# Patient Record
Sex: Male | Born: 1990 | Race: White | Hispanic: No | Marital: Single | Smoking: Current every day smoker
Health system: Southern US, Community
[De-identification: ages and names within clinical notes are randomized; demographics above are authoritative.]

## PROBLEM LIST (undated history)

## (undated) HISTORY — PX: AMPUTATION: SHX166

---
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
## 2009-01-11 ENCOUNTER — Emergency Department (HOSPITAL_COMMUNITY): Admission: EM | Admit: 2009-01-11 | Discharge: 2009-01-11 | Payer: Self-pay | Admitting: Emergency Medicine

## 2009-06-11 ENCOUNTER — Emergency Department (HOSPITAL_COMMUNITY): Admission: EM | Admit: 2009-06-11 | Discharge: 2009-06-12 | Payer: Self-pay | Admitting: Emergency Medicine

## 2010-10-25 ENCOUNTER — Emergency Department (HOSPITAL_COMMUNITY)
Admission: EM | Admit: 2010-10-25 | Discharge: 2010-10-25 | Disposition: A | Discharge status: Home / Self Care | Payer: Self-pay | Attending: Emergency Medicine | Admitting: Emergency Medicine

## 2010-10-25 DIAGNOSIS — M545 Low back pain, unspecified: Secondary | ICD-10-CM | POA: Insufficient documentation

## 2010-10-25 DIAGNOSIS — X500XXA Overexertion from strenuous movement or load, initial encounter: Secondary | ICD-10-CM | POA: Insufficient documentation

## 2010-10-25 DIAGNOSIS — IMO0002 Reserved for concepts with insufficient information to code with codable children: Secondary | ICD-10-CM | POA: Insufficient documentation

## 2010-10-25 DIAGNOSIS — Y990 Civilian activity done for income or pay: Secondary | ICD-10-CM | POA: Insufficient documentation

## 2010-10-25 DIAGNOSIS — R110 Nausea: Secondary | ICD-10-CM | POA: Insufficient documentation

## 2010-10-25 DIAGNOSIS — S335XXA Sprain of ligaments of lumbar spine, initial encounter: Secondary | ICD-10-CM | POA: Insufficient documentation

## 2010-10-25 DIAGNOSIS — M4120 Other idiopathic scoliosis, site unspecified: Secondary | ICD-10-CM | POA: Insufficient documentation

## 2011-05-04 ENCOUNTER — Emergency Department (HOSPITAL_COMMUNITY)
Admission: EM | Admit: 2011-05-04 | Discharge: 2011-05-04 | Disposition: A | Discharge status: Home / Self Care | Payer: Self-pay | Attending: Emergency Medicine | Admitting: Emergency Medicine

## 2011-05-04 ENCOUNTER — Emergency Department (HOSPITAL_COMMUNITY): Payer: Self-pay

## 2011-05-04 ENCOUNTER — Encounter (HOSPITAL_COMMUNITY): Payer: Self-pay | Admitting: Emergency Medicine

## 2011-05-04 DIAGNOSIS — Z79899 Other long term (current) drug therapy: Secondary | ICD-10-CM | POA: Insufficient documentation

## 2011-05-04 DIAGNOSIS — M545 Low back pain, unspecified: Secondary | ICD-10-CM | POA: Insufficient documentation

## 2011-05-04 DIAGNOSIS — X500XXA Overexertion from strenuous movement or load, initial encounter: Secondary | ICD-10-CM | POA: Insufficient documentation

## 2011-05-04 DIAGNOSIS — Y990 Civilian activity done for income or pay: Secondary | ICD-10-CM | POA: Insufficient documentation

## 2011-05-04 DIAGNOSIS — S39012A Strain of muscle, fascia and tendon of lower back, initial encounter: Secondary | ICD-10-CM

## 2011-05-04 DIAGNOSIS — S339XXA Sprain of unspecified parts of lumbar spine and pelvis, initial encounter: Secondary | ICD-10-CM | POA: Insufficient documentation

## 2011-05-04 MED ORDER — IBUPROFEN 800 MG PO TABS
800.0000 mg | ORAL_TABLET | Freq: Once | ORAL | Status: AC
  Administered 2011-05-04: 800 mg via ORAL
  Filled 2011-05-04: qty 1

## 2011-05-04 MED ORDER — CYCLOBENZAPRINE HCL 10 MG PO TABS: 10.0000 mg | ORAL_TABLET | Freq: Three times a day (TID) | ORAL | Status: AC | PRN

## 2011-05-04 MED ORDER — OXYCODONE-ACETAMINOPHEN 5-325 MG PO TABS
1.0000 | ORAL_TABLET | Freq: Once | ORAL | Status: AC
  Administered 2011-05-04: 1 via ORAL
  Filled 2011-05-04: qty 1

## 2011-05-04 MED ORDER — CYCLOBENZAPRINE HCL 10 MG PO TABS
10.0000 mg | ORAL_TABLET | Freq: Once | ORAL | Status: AC
  Administered 2011-05-04: 10 mg via ORAL
  Filled 2011-05-04: qty 1

## 2011-05-04 MED ORDER — OXYCODONE-ACETAMINOPHEN 5-325 MG PO TABS: 1.0000 | ORAL_TABLET | ORAL | Status: AC | PRN

## 2011-05-04 NOTE — ED Provider Notes (Signed)
History     CSN: [id number]  Arrival date & time 05/04/11  2000   First MD Initiated Contact with Patient 05/04/11 2128      Chief Complaint  Patient presents with  . Back Pain    (Consider location/radiation/quality/duration/timing/severity/associated sxs/prior treatment) Patient is a 21 y.o. male presenting with back pain. The history is provided by the patient.  Back Pain   He was doing some lifting at work, and thinks that he got into an altered position. He felt something pull in his lower back. He has a history of prior low back injury. Since then, he has had pain in the midline of his lower back without radiation. Pain is moderately severe at 8/10. Is worse with certain positions and moving in certain ways. He tried taking ibuprofen 400 mg with no relief. He denies radiation of pain denies weakness, numbness, tingling, and denies bowel or bladder dysfunction.  History reviewed. No pertinent past medical history.  History reviewed. No pertinent past surgical history.  No family history on file.  History  Substance Use Topics  . Smoking status: Never Smoker   . Smokeless tobacco: Not on file  . Alcohol Use: No      Review of Systems  Musculoskeletal: Positive for back pain.  All other systems reviewed and are negative.    Allergies  Ceclor  Home Medications   Current Outpatient Rx  Name Route Sig Dispense Refill  . CYCLOBENZAPRINE HCL 10 MG PO TABS Oral Take 1 tablet (10 mg total) by mouth 3 (three) times daily as needed for muscle spasms. 30 tablet 0  . OXYCODONE-ACETAMINOPHEN 5-325 MG PO TABS Oral Take 1 tablet by mouth every 4 (four) hours as needed for pain. 20 tablet 0    BP 98/45  Pulse 87  Temp(Src) 98.5 F (36.9 C) (Oral)  Resp 20  SpO2 97%  Physical Exam  Nursing note and vitals reviewed.  21 year old male is resting comfortably in no acute distress. Vital signs are normal. Oxygen saturation is 97% which is normal. Head is normocephalic and  atraumatic. PERRLA, EOMI. Her Chalmers Guest is clear. Neck is nontender and supple. Back has moderate tenderness in the lumbar spine with moderate bilateral paralumbar spasm. Straight leg raise is negative. Lungs are clear without rales, wheezes, rhonchi. Heart has regular rate and rhythm without murmur. Abdomen is soft, flat, nontender without masses or hepatosplenomegaly. Extremities have full range of motion, no cyanosis or edema. Skin is warm and dry without rash. Neurologic: Mental status is normal, cranial nerves are intact, there no focal motor or sensory deficits.  ED Course  Procedures (including critical care time)  Labs Reviewed - No data to display Dg Lumbar Spine Complete  05/04/2011  *RADIOLOGY REPORT*  Clinical Data: Low back pain after lifting  LUMBAR SPINE - COMPLETE 4+ VIEW  Comparison: None.  Findings: There is transitional anatomy.  L5 is transitional.  Disc heights are within normal limits.  No pars defects.  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 These can be painful.  IMPRESSION: Transitional L5.  No acute or traumatic finding.  Transitional anatomy can predispose to pain.  Original Report Authenticated By: Thomasenia Sales, M.D.     1. Lumbosacral strain       MDM  Musculoskeletal back pain related to poor lifting technique. He is sent home with a prescription for Percocet and Flexeril. He is to take over-the-counter ibuprofen or naproxen for anti-inflammatory effect.        Onalee Hua  Preston Fleeting, MD 05/05/11 1500

## 2011-05-04 NOTE — ED Notes (Signed)
Pt to ED c/o lower back pain x 2 days after lifting metal.  Denies numbness, tingling or change in bowel or bladder habits.  States no relief with ibuprofen and pain increases on movement and bending.

## 2011-05-04 NOTE — Discharge Instructions (Signed)
In addition to the prescription medicines, and he should take an anti-inflammatory medication. Take 2 Aleve tablets at a time, twice a day. Alternatively, you can take 12 ibuprofen tablets a day-either 3 tablets at a time 4 times a day, or 4 tablets at a time 3 times a day. Pay attention to proper posture, bending, and lifting techniques.  Lumbosacral Strain Lumbosacral strain is one of the most common causes of back pain. There are many causes of back pain. Most are not serious conditions. CAUSES  Your backbone (spinal column) is made up of 24 main vertebral bodies, the sacrum, and the coccyx. These are held together by muscles and tough, fibrous tissue (ligaments). Nerve roots pass through the openings between the vertebrae. A sudden move or injury to the back may cause injury to, or pressure on, these nerves. This may result in localized back pain or pain movement (radiation) into the buttocks, down the leg, and into the foot. Sharp, shooting pain from the buttock down the back of the leg (sciatica) is frequently associated with a ruptured (herniated) disk. Pain may be caused by muscle spasm alone. Your caregiver can often find the cause of your pain by the details of your symptoms and an exam. In some cases, you may need tests (such as X-rays). Your caregiver will work with you to decide if any tests are needed based on your specific exam. HOME CARE INSTRUCTIONS   Avoid an underactive lifestyle. Active exercise, as directed by your caregiver, is your greatest weapon against back pain.   Avoid hard physical activities (tennis, racquetball, waterskiing) if you are not in proper physical condition for it. This may aggravate or create problems.   If you have a back problem, avoid sports requiring sudden body movements. Swimming and walking are generally safer activities.   Maintain good posture.   Avoid becoming overweight (obese).   Use bed rest for only the most extreme, sudden (acute) episode.  Your caregiver will help you determine how much bed rest is necessary.   For acute conditions, you may put ice on the injured area.   Put ice in a plastic bag.   Place a towel between your skin and the bag.   Leave the ice on for 15 to 20 minutes at a time, every 2 hours, or as needed.   After you are improved and more active, it may help to apply heat for 30 minutes before activities.  See your caregiver if you are having pain that lasts longer than expected. Your caregiver can advise appropriate exercises or therapy if needed. With conditioning, most back problems can be avoided. SEEK IMMEDIATE MEDICAL CARE IF:   You have numbness, tingling, weakness, or problems with the use of your arms or legs.   You experience severe back pain not relieved with medicines.   There is a change in bowel or bladder control.   You have increasing pain in any area of the body, including your belly (abdomen).   You notice shortness of breath, dizziness, or feel faint.   You feel sick to your stomach (nauseous), are throwing up (vomiting), or become sweaty.   You notice discoloration of your toes or legs, or your feet get very cold.   Your back pain is getting worse.   You have a fever.  MAKE SURE YOU:   Understand these instructions.  Will watch your condition. Cyclobenzaprine tablets What is this medicine? CYCLOBENZAPRINE (sye kloe BEN za preen) is a muscle relaxer. It is used  to treat muscle pain, spasms, and stiffness. This medicine may be used for other purposes; ask your health care provider or pharmacist if you have questions. What should I tell my health care provider before I take this medicine? They need to know if you have any of these conditions: -heart disease, irregular heartbeat, or previous heart attack -liver disease -thyroid problem -an unusual or allergic reaction to cyclobenzaprine, tricyclic antidepressants, lactose, other medicines, foods, dyes, or  preservatives -pregnant or trying to get pregnant -breast-feeding How should I use this medicine? Take this medicine by mouth with a glass of water. Follow the directions on the prescription label. If this medicine upsets your stomach, take it with food or milk. Take your medicine at regular intervals. Do not take it more often than directed. Talk to your pediatrician regarding the use of this medicine in children. Special care may be needed. Overdosage: If you think you have taken too much of this medicine contact a poison control center or emergency room at once. NOTE: This medicine is only for you. Do not share this medicine with others. What if I miss a dose? If you miss a dose, take it as soon as you can. If it is almost time for your next dose, take only that dose. Do not take double or extra doses. What may interact with this medicine? Do not take this medicine with any of the following medications: -cisapride -droperidol -flecainide -grepafloxacin -halofantrine -levomethadyl -MAOIs like Carbex, Eldepryl, Marplan, Nardil, and Parnate -nilotinib -pimozide -probucol -sertindole This medicine may also interact with the following medications: -abarelix -alcohol -contrast dyes -dolasetron -guanethidine -medicines for cancer -medicines for depression, anxiety, or psychotic disturbances -medicines to treat an irregular heartbeat -medicines used for sleep or numbness during surgery or procedure -methadone -octreotide -ondansetron -palonosetron -phenothiazines like chlorpromazine, mesoridazine, prochlorperazine, thioridazine -some medicines for infection like alfuzosin, chloroquine, clarithromycin, levofloxacin, mefloquine, pentamidine, troleandomycin -tramadol -vardenafil This list may not describe all possible interactions. Give your health care provider a list of all the medicines, herbs, non-prescription drugs, or dietary supplements you use. Also tell them if you smoke,  drink alcohol, or use illegal drugs. Some items may interact with your medicine. What should I watch for while using this medicine? Check with your doctor or health care professional if your condition does not improve within 1 to 3 weeks. You may get drowsy or dizzy when you first start taking the medicine or change doses. Do not drive, use machinery, or do anything that may be dangerous until you know how the medicine affects you. Stand or sit up slowly. Your mouth may get dry. Drinking water, chewing sugarless gum, or sucking on hard candy may help. What side effects may I notice from receiving this medicine? Side effects that you should report to your doctor or health care professional as soon as possible: -allergic reactions like skin rash, itching or hives, swelling of the face, lips, or tongue -chest pain -fast heartbeat -hallucinations -seizures -vomiting Side effects that usually do not require medical attention (report to your doctor or health care professional if they continue or are bothersome): -headache This list may not describe all possible side effects. Call your doctor for medical advice about side effects. You may report side effects to FDA at 1-800-FDA-1088. Where should I keep my medicine? Keep out of the reach of children. Store at room temperature between 15 and 30 degrees C (59 and 86 degrees F). Keep container tightly closed. Throw away any unused medicine after the expiration date.  NOTE: This sheet is a summary. It may not cover all possible information. If you have questions about this medicine, talk to your doctor, pharmacist, or health care provider.   2012, Elsevier/Gold Standard. (05/25/2007 10:26:21 PM)  Will get help right away if you are not doing well or get worse.  Document Released: 11/21/2004 Document Revised: 01/31/2011 Document Reviewed: 05/13/2008 [facility] Patient Information 2012 [place], [place].  Acetaminophen; Oxycodone tablets What is this  medicine? ACETAMINOPHEN; OXYCODONE (a set a MEE noe fen; ox i KOE done) is a pain reliever. It is used to treat mild to moderate pain. This medicine may be used for other purposes; ask your health care provider or pharmacist if you have questions. What should I tell my health care provider before I take this medicine? They need to know if you have any of these conditions: -brain tumor -Crohn's disease, inflammatory bowel disease, or ulcerative colitis -drink more than 3 alcohol containing drinks per day -drug abuse or addiction -head injury -heart or circulation problems -kidney disease or problems going to the bathroom -liver disease -lung disease, asthma, or breathing problems -an unusual or allergic reaction to acetaminophen, oxycodone, other opioid analgesics, other medicines, foods, dyes, or preservatives -pregnant or trying to get pregnant -breast-feeding How should I use this medicine? Take this medicine by mouth with a full glass of water. Follow the directions on the prescription label. Take your medicine at regular intervals. Do not take your medicine more often than directed. Talk to your pediatrician regarding the use of this medicine in children. Special care may be needed. Patients over 23 years old may have a stronger reaction and need a smaller dose. Overdosage: If you think you have taken too much of this medicine contact a poison control center or emergency room at once. NOTE: This medicine is only for you. Do not share this medicine with others. What if I miss a dose? If you miss a dose, take it as soon as you can. If it is almost time for your next dose, take only that dose. Do not take double or extra doses. What may interact with this medicine? -alcohol or medicines that contain alcohol -antihistamines -barbiturates like amobarbital, butalbital, butabarbital, methohexital, pentobarbital, phenobarbital, thiopental, and secobarbital -benztropine -drugs for bladder  problems like solifenacin, trospium, oxybutynin, tolterodine, hyoscyamine, and methscopolamine -drugs for breathing problems like ipratropium and tiotropium -drugs for certain stomach or intestine problems like propantheline, homatropine methylbromide, glycopyrrolate, atropine, belladonna, and dicyclomine -general anesthetics like etomidate, ketamine, nitrous oxide, propofol, desflurane, enflurane, halothane, isoflurane, and sevoflurane -medicines for depression, anxiety, or psychotic disturbances -medicines for pain like codeine, morphine, pentazocine, buprenorphine, butorphanol, nalbuphine, tramadol, and propoxyphene -medicines for sleep -muscle relaxants -naltrexone -phenothiazines like perphenazine, thioridazine, chlorpromazine, mesoridazine, fluphenazine, prochlorperazine, promazine, and trifluoperazine -scopolamine -trihexyphenidyl This list may not describe all possible interactions. Give your health care provider a list of all the medicines, herbs, non-prescription drugs, or dietary supplements you use. Also tell them if you smoke, drink alcohol, or use illegal drugs. Some items may interact with your medicine. What should I watch for while using this medicine? Tell your doctor or health care professional if your pain does not go away, if it gets worse, or if you have new or a different type of pain. You may develop tolerance to the medicine. Tolerance means that you will need a higher dose of the medication for pain relief. Tolerance is normal and is expected if you take this medicine for a long time. Do not suddenly stop taking  your medicine because you may develop a severe reaction. Your body becomes used to the medicine. This does NOT mean you are addicted. Addiction is a behavior related to getting and using a drug for a nonmedical reason. If you have pain, you have a medical reason to take pain medicine. Your doctor will tell you how much medicine to take. If your doctor wants you to  stop the medicine, the dose will be slowly lowered over time to avoid any side effects. You may get drowsy or dizzy. Do not drive, use machinery, or do anything that needs mental alertness until you know how this medicine affects you. Do not stand or sit up quickly, especially if you are an older patient. This reduces the risk of dizzy or fainting spells. Alcohol may interfere with the effect of this medicine. Avoid alcoholic drinks. The medicine will cause constipation. Try to have a bowel movement at least every 2 to 3 days. If you do not have a bowel movement for 3 days, call your doctor or health care professional. Do not take Tylenol (acetaminophen) or medicines that have acetaminophen with this medicine. Too much acetaminophen can be very dangerous. Many nonprescription medicines contain acetaminophen. Always read the labels carefully to avoid taking more acetaminophen. What side effects may I notice from receiving this medicine? Side effects that you should report to your doctor or health care professional as soon as possible: -allergic reactions like skin rash, itching or hives, swelling of the face, lips, or tongue -breathing difficulties, wheezing -confusion -light headedness or fainting spells -severe stomach pain -yellowing of the skin or the whites of the eyes Side effects that usually do not require medical attention (report to your doctor or health care professional if they continue or are bothersome): -dizziness -drowsiness -nausea -vomiting This list may not describe all possible side effects. Call your doctor for medical advice about side effects. You may report side effects to FDA at 1-800-FDA-1088. Where should I keep my medicine? Keep out of the reach of children. This medicine can be abused. Keep your medicine in a safe place to protect it from theft. Do not share this medicine with anyone. Selling or giving away this medicine is dangerous and against the law. Store at room  temperature between 20 and 25 degrees C (68 and 77 degrees F). Keep container tightly closed. Protect from light. Flush any unused medicines down the toilet. Do not use the medicine after the expiration date. NOTE: This sheet is a summary. It may not cover all possible information. If you have questions about this medicine, talk to your doctor, pharmacist, or health care provider.  2012, Elsevier/Gold Standard. (01/11/2008 10:01:21 AM)  RESOURCE GUIDE  Dental Problems  Patients with Medicaid: [facility] [phone] [address]                                           [phone] [address] Phone:  [phone]                                                  Phone:  [phone]  If  unable to pay or uninsured, contact:  [facility] or [facility]. to become qualified for the adult dental clinic.  Chronic Pain Problems Contact [facility]  [phone] Patients need to be referred by their primary care doctor.  Insufficient Money for Medicine Contact [employer]:  call "211" or [facility] ([phone].  No Primary Care Doctor Call [facility]  727-005-1059 Other agencies that provide inexpensive medical care    [facility]  413-327-2680    [facility]  8603030454    [facility]  2516169854    [facility]  [phone]    [facility]anned Parenthood  9297294268    [facility]  [phone]  Psychological Services [facility]  [phone] [facility]  [phone] [facility]   [phone] (emergency services [phone])  Substance Abuse Resources [facility]  930-586-6560 [facility] 7256293462 [facility] [place] [phone] Floydene Flock [phone] Residential & Outpatient Substance Abuse Program  [phone]  Abuse/Neglect [facility] Child Abuse Hotline 4138050458 [facility] Child Abuse  Hotline 705-519-3996 (After Hours)  Emergency Shelter [facility] 6705368325  M[facility] Homes [facility] [place] [facility] [place] [phone] [facility] 6800651825  MRSA Hotline #:   309-075-7285    [facility] Resources  [facility]     [employer]                          [facility] Dept. [address] [place]                       [address]      [address]  Blondell Reveal Phone:  [phone]                                   Phone:  [phone]                 Phone:  [phone]  [facility] Phone:  [phone]  [facility] Child Abuse Hotline [phone] (After Hours)

## 2011-05-04 NOTE — ED Notes (Signed)
Pt ambulatory with 8/10 pain to low back.  Denies n/v NAD.

## 2011-05-04 NOTE — ED Notes (Signed)
PT. 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  AMBULATORY.

## 2011-07-11 ENCOUNTER — Emergency Department (HOSPITAL_COMMUNITY)
Admission: EM | Admit: 2011-07-11 | Discharge: 2011-07-11 | Disposition: A | Discharge status: Home / Self Care | Payer: Self-pay | Attending: Emergency Medicine | Admitting: Emergency Medicine

## 2011-07-11 ENCOUNTER — Encounter (HOSPITAL_COMMUNITY): Payer: Self-pay | Admitting: *Deleted

## 2011-07-11 DIAGNOSIS — F17200 Nicotine dependence, unspecified, uncomplicated: Secondary | ICD-10-CM | POA: Insufficient documentation

## 2011-07-11 DIAGNOSIS — K047 Periapical abscess without sinus: Secondary | ICD-10-CM | POA: Insufficient documentation

## 2011-07-11 MED ORDER — HYDROCODONE-ACETAMINOPHEN 5-325 MG PO TABS: 1.0000 | ORAL_TABLET | ORAL | Status: AC | PRN

## 2011-07-11 MED ORDER — AMOXICILLIN 500 MG PO CAPS: 500.0000 mg | ORAL_CAPSULE | Freq: Three times a day (TID) | ORAL | Status: AC

## 2011-07-11 NOTE — ED Provider Notes (Signed)
Medical screening examination/treatment/procedure(s) were performed by non-physician practitioner and as supervising physician I was immediately available for consultation/collaboration.   Caleesi Kohl Y. Rose Hippler, MD 07/11/11 2315 

## 2011-07-11 NOTE — Discharge Instructions (Signed)
Abscessed Tooth A tooth abscess is a collection of infected fluid (pus) from a bacterial infection in the inner part of the tooth (pulp). It usually occurs at the end of the tooth's root.  CAUSES   A very bad cavity (extensive tooth decay).   Trauma to the tooth, such as a broken or chipped tooth, that allows bacteria to enter into the pulp.  SYMPTOMS  Severe pain in and around the infected tooth.   Swelling and redness around the abscessed tooth or in the mouth or face.   Tenderness.   Pus drainage.   Bad breath.   Bitter taste in the mouth.   Difficulty swallowing.   Difficulty opening the mouth.   Feeling sick to your stomach (nauseous).   Vomiting.   Chills.   Swollen neck glands.  DIAGNOSIS  A medical and dental history will be taken.   An examination will be performed by tapping on the abscessed tooth.   X-rays may be taken of the tooth to identify the abscess.  TREATMENT The goal of treatment is to eliminate the infection.   You may be prescribed antibiotic medicine to stop the infection from spreading.   A root canal may be performed to save the tooth. If the tooth cannot be saved, it may be pulled (extracted) and the abscess may be drained.  HOME CARE INSTRUCTIONS  Only take over-the-counter or prescription medicines for pain, fever, or discomfort as directed by your caregiver.   Do not drive after taking pain medicine (narcotics).   Rinse your mouth (gargle) often with salt water ( tsp salt in 8 oz of warm water) to relieve pain or swelling.   Do not apply heat to the outside of your face.   Return to your dentist for further treatment as directed.  SEEK IMMEDIATE DENTAL CARE IF:  You have a temperature by mouth above 102 F (38.9 C), not controlled by medicine.   You have chills or a very bad headache.   You have problems breathing or swallowing.   Your have trouble opening your mouth.   You develop swelling in the neck or around the eye.    Your pain is not helped by medicine.   Your pain is getting worse instead of better.  Document Released: 02/11/2005 Document Revised: 01/31/2011 Document Reviewed: 05/22/2010 [facility] Patient Information 2012 [place], [place].Dental Abscess A dental abscess usually starts from an infected tooth. Antibiotic medicine and pain pills can be helpful, but dental infections require the attention of a dentist. Rinse around the infected area often with salt water (a pinch of salt in 8 oz of warm water). Do not apply heat to the outside of your face. See your dentist or oral surgeon as soon as possible.  SEEK IMMEDIATE MEDICAL CARE IF:  You have increasing, severe pain that is not relieved by medicine.   You or your child has an oral temperature above 102 F (38.9 C), not controlled by medicine.   Your baby is older than 3 months with a rectal temperature of 102 F (38.9 C) or higher.   Your baby is 9 months old or younger with a rectal temperature of 100.4 F (38 C) or higher.   You develop chills, severe headache, difficulty breathing, or trouble swallowing.   You have swelling in the neck or around the eye.  Document Released: 02/11/2005 Document Revised: 01/31/2011 Document Reviewed: 07/23/2006 [facility] Patient Information 2012 [employer] [employer], [place].Dental Care and Dentist Visits Dental care supports good overall health. Regular dental  visits can also help you avoid dental pain, bleeding, infection, and other more serious health problems in the future. It is important to keep the mouth healthy because diseases in the teeth, gums, and other oral tissues can spread to other areas of the body. Some problems, such as diabetes, heart disease, and pre-term labor have been associated with poor oral health.  See your dentist every 6 months. If you experience emergency problems such as a toothache or broken tooth, go to the dentist right away. If you see your dentist regularly, you may catch problems early.  It is easier to be treated for problems in the early stages.  WHAT TO EXPECT AT A DENTIST VISIT  Your dentist will look for many common oral health problems and recommend proper treatment. At your regular dental visit, you can expect:  Gentle cleaning of the teeth and gums. This includes scraping and polishing. This helps to remove the sticky substance around the teeth and gums (plaque). Plaque forms in the mouth shortly after eating. Over time, plaque hardens on the teeth as tartar. If tartar is not removed regularly, it can cause problems. Cleaning also helps remove stains.   Periodic X-rays. These pictures of the teeth and supporting bone will help your dentist assess the health of your teeth.   Periodic fluoride treatments. Fluoride is a natural mineral shown to help strengthen teeth. Fluoride treatmentinvolves applying a fluoride gel or varnish to the teeth. It is most commonly done in children.   Examination of the mouth, tongue, jaws, teeth, and gums to look for any oral health problems, such as:   Cavities (dental caries). This is decay on the tooth caused by plaque, sugar, and acid in the mouth. It is best to catch a cavity when it is small.   Inflammation of the gums caused by plaque buildup (gingivitis).   Problems with the mouth or malformed or misaligned teeth.   Oral cancer or other diseases of the soft tissues or jaws.  KEEP YOUR TEETH AND GUMS HEALTHY For healthy teeth and gums, follow these general guidelines as well as your dentist's specific advice:  Have your teeth professionally cleaned at the dentist every 6 months.   Brush twice daily with a fluoride toothpaste.   Floss your teeth daily.   Ask your dentist if you need fluoride supplements, treatments, or fluoride toothpaste.   Eat a healthy diet. Reduce foods and drinks with added sugar.   Avoid smoking.  TREATMENT FOR ORAL HEALTH PROBLEMS If you have oral health problems, treatment varies depending on the  conditions present in your teeth and gums.  Your caregiver will most likely recommend good oral hygiene at each visit.   For cavities, gingivitis, or other oral health disease, your caregiver will perform a procedure to treat the problem. This is typically done at a separate appointment. Sometimes your caregiver will refer you to another dental specialist for specific tooth problems or for surgery.  SEEK IMMEDIATE DENTAL CARE IF:  You have pain, bleeding, or soreness in the gum, tooth, jaw, or mouth area.   A permanent tooth becomes loose or separated from the gum socket.   You experience a blow or injury to the mouth or jaw area.  Document Released: 10/24/2010 Document Revised: 01/31/2011 Document Reviewed: 10/24/2010 [facility] Patient Information 2012 [place], [place].

## 2011-07-11 NOTE — ED Provider Notes (Signed)
History     CSN: [id number]  Arrival date & time 07/11/11  Barry Brunner   First MD Initiated Contact with Patient 07/11/11 2000      Chief Complaint  Patient presents with  . Dental Pain    (Consider location/radiation/quality/duration/timing/severity/associated sxs/prior treatment) HPI Comments: Patient here with left lower 3rd molar pain - reports started yesterday - no known injury, no trismus, no prior history of dental issues - no fever or chills.  Patient is a 21 y.o. male presenting with tooth pain. The history is provided by the patient. No language interpreter was used.  Dental PainThe primary symptoms include mouth pain. Primary symptoms do not include dental injury, oral bleeding, oral lesions, headaches, fever, shortness of breath, sore throat, angioedema or cough. The symptoms began yesterday. The symptoms are unchanged. The symptoms are new. The symptoms occur constantly.  Additional symptoms include: dental sensitivity to temperature, gum swelling, gum tenderness and jaw pain. Additional symptoms do not include: purulent gums, trismus, facial swelling, trouble swallowing, pain with swallowing, excessive salivation, dry mouth, taste disturbance, smell disturbance, drooling, ear pain, hearing loss, nosebleeds, swollen glands, goiter and fatigue. Medical issues include: smoking.    History reviewed. No pertinent past medical history.  History reviewed. No pertinent past surgical history.  History reviewed. No pertinent family history.  History  Substance Use Topics  . Smoking status: Current Everyday Smoker -- 0.5 packs/day  . Smokeless tobacco: Not on file  . Alcohol Use: No      Review of Systems  Constitutional: Negative for fever and fatigue.  HENT: Negative for hearing loss, ear pain, nosebleeds, sore throat, facial swelling, drooling and trouble swallowing.   Respiratory: Negative for cough and shortness of breath.   Neurological: Negative for headaches.  All other  systems reviewed and are negative.    Allergies  Ceclor  Home Medications  No current outpatient prescriptions on file.  BP 132/64  Pulse 96  Temp(Src) 97.9 F (36.6 C) (Oral)  Resp 16  SpO2 100%  Physical Exam  Nursing note and vitals reviewed. Constitutional: He is oriented to person, place, and time. He appears well-developed and well-nourished. No distress.  HENT:  Head: Normocephalic and atraumatic.  Right Ear: External ear normal.  Left Ear: External ear normal.  Nose: Nose normal.  Mouth/Throat: Oropharynx is clear and moist. Dental abscesses present. No oropharyngeal exudate.    Eyes: Conjunctivae are normal. Pupils are equal, round, and reactive to light. No scleral icterus.  Neck: Normal range of motion. Neck supple.  Cardiovascular: Normal rate and regular rhythm.  Exam reveals no gallop and no friction rub.   Murmur heard.      3/5 systolic murmur  Pulmonary/Chest: Effort normal and breath sounds normal. No respiratory distress. He has no wheezes. He has no rales. He exhibits no tenderness.  Abdominal: Soft. Bowel sounds are normal. He exhibits no distension and no mass. There is no tenderness. There is no rebound and no guarding.  Musculoskeletal: Normal range of motion. He exhibits no edema and no tenderness.  Lymphadenopathy:    He has no cervical adenopathy.  Neurological: He is alert and oriented to person, place, and time. No cranial nerve deficit.  Skin: Skin is warm and dry. No rash noted. No erythema. No pallor.  Psychiatric: He has a normal mood and affect. His behavior is normal. Judgment and thought content normal.    ED Course  Procedures (including critical care time)  Labs Reviewed - No data to display No results found.  Dental Abscess    MDM  Patient with cavity at left 3rd molar with pain and early abscess formation, will treat for same, no evidence of facial swelling or trismus.        Izola Price [place], [place] 07/11/11  2008

## 2011-07-11 NOTE — ED Notes (Signed)
Pt c/o dental pain on left lower back tooth since last night, no known dental problems, tylenol at home

## 2011-09-21 ENCOUNTER — Emergency Department (HOSPITAL_COMMUNITY)
Admission: EM | Admit: 2011-09-21 | Discharge: 2011-09-21 | Disposition: A | Discharge status: Home / Self Care | Payer: Self-pay | Attending: Emergency Medicine | Admitting: Emergency Medicine

## 2011-09-21 ENCOUNTER — Encounter (HOSPITAL_COMMUNITY): Payer: Self-pay

## 2011-09-21 DIAGNOSIS — K0889 Other specified disorders of teeth and supporting structures: Secondary | ICD-10-CM

## 2011-09-21 DIAGNOSIS — F17200 Nicotine dependence, unspecified, uncomplicated: Secondary | ICD-10-CM | POA: Insufficient documentation

## 2011-09-21 DIAGNOSIS — K089 Disorder of teeth and supporting structures, unspecified: Secondary | ICD-10-CM | POA: Insufficient documentation

## 2011-09-21 MED ORDER — HYDROCODONE-ACETAMINOPHEN 5-325 MG PO TABS: 1.0000 | ORAL_TABLET | ORAL | Status: AC | PRN

## 2011-09-21 MED ORDER — LIDOCAINE VISCOUS 2 % MT SOLN
20.0000 mL | Freq: Once | OROMUCOSAL | Status: AC
  Administered 2011-09-21: 20 mL via OROMUCOSAL
  Filled 2011-09-21: qty 15

## 2011-09-21 MED ORDER — HYDROCODONE-ACETAMINOPHEN 5-325 MG PO TABS
1.0000 | ORAL_TABLET | Freq: Once | ORAL | Status: AC
  Administered 2011-09-21: 1 via ORAL
  Filled 2011-09-21: qty 1

## 2011-09-21 NOTE — ED Provider Notes (Signed)
History     CSN: [id number]  Arrival date & time 09/21/11  1308   First MD Initiated Contact with Patient 09/21/11 1832      Chief Complaint  Patient presents with  . Dental Pain    (Consider location/radiation/quality/duration/timing/severity/associated sxs/prior treatment) Patient is a 21 y.o. male presenting with tooth pain.  Dental Pain  This generally well male presents with 3 days of pain on his left lower jaw.  Pain began insidiously, since onset has been dull, sore, worsening, severe, not improved with ibuprofen.  He denies concurrent nausea, vomiting, headache, fevers, chills, chest pain, dyspnea. No past medical history on file.  No past surgical history on file.  No family history on file.  History  Substance Use Topics  . Smoking status: Current Everyday Smoker -- 0.5 packs/day  . Smokeless tobacco: Not on file  . Alcohol Use: No      Review of Systems  All other systems reviewed and are negative.    Allergies  Ceclor  Home Medications   Current Outpatient Rx  Name Route Sig Dispense Refill  . HYDROCODONE-ACETAMINOPHEN 5-325 MG PO TABS Oral Take 1 tablet by mouth every 4 (four) hours as needed for pain. 15 tablet 0    BP 126/59  Pulse 86  Temp 98.4 F (36.9 C) (Oral)  Resp 18  SpO2 99%  Physical Exam  Nursing note and vitals reviewed. Constitutional: He is oriented to person, place, and time. Vital signs are normal. He appears well-developed and well-nourished.  HENT:  Head: Normocephalic and atraumatic.  Mouth/Throat: Uvula is midline, oropharynx is clear and moist and mucous membranes are normal. No oropharyngeal exudate, posterior oropharyngeal edema, posterior oropharyngeal erythema or tonsillar abscesses.    Eyes: Conjunctivae and EOM are normal. Pupils are equal, round, and reactive to light.  Pulmonary/Chest: Effort normal. No stridor. No respiratory distress.  Musculoskeletal: He exhibits no edema.  Lymphadenopathy:    He has no  cervical adenopathy.  Neurological: He is alert and oriented to person, place, and time. No cranial nerve deficit. He exhibits normal muscle tone. Coordination normal.  Skin: Skin is warm and dry.  Psychiatric: He has a normal mood and affect.    ED Course  Procedures (including critical care time)  Labs Reviewed - No data to display No results found.   1. Pain, dental       MDM  This generally well-appearing young male presents with degrees of dental pain, on exam has an erupting wisdom tooth.  Absent endorsement of systemic complaints, or any evidence of ongoing infection, antibiotics are not indicated.  This provided analgesics, discharged in stable condition.  Gerhard Munch, MD 09/21/11 [phone]

## 2011-09-21 NOTE — ED Notes (Signed)
Pt complains of dental pain on right side for three to four days.

## 2012-10-25 ENCOUNTER — Encounter (HOSPITAL_COMMUNITY): Payer: Self-pay | Admitting: Emergency Medicine

## 2012-10-25 ENCOUNTER — Emergency Department (HOSPITAL_COMMUNITY)
Admission: EM | Admit: 2012-10-25 | Discharge: 2012-10-25 | Disposition: A | Discharge status: Home / Self Care | Payer: Self-pay | Attending: Emergency Medicine | Admitting: Emergency Medicine

## 2012-10-25 ENCOUNTER — Emergency Department (HOSPITAL_COMMUNITY): Payer: Self-pay

## 2012-10-25 DIAGNOSIS — Y9389 Activity, other specified: Secondary | ICD-10-CM | POA: Insufficient documentation

## 2012-10-25 DIAGNOSIS — M549 Dorsalgia, unspecified: Secondary | ICD-10-CM

## 2012-10-25 DIAGNOSIS — Y929 Unspecified place or not applicable: Secondary | ICD-10-CM | POA: Insufficient documentation

## 2012-10-25 DIAGNOSIS — W11XXXA Fall on and from ladder, initial encounter: Secondary | ICD-10-CM | POA: Insufficient documentation

## 2012-10-25 DIAGNOSIS — F17200 Nicotine dependence, unspecified, uncomplicated: Secondary | ICD-10-CM | POA: Insufficient documentation

## 2012-10-25 DIAGNOSIS — W19XXXA Unspecified fall, initial encounter: Secondary | ICD-10-CM

## 2012-10-25 DIAGNOSIS — IMO0002 Reserved for concepts with insufficient information to code with codable children: Secondary | ICD-10-CM | POA: Insufficient documentation

## 2012-10-25 MED ORDER — IBUPROFEN 800 MG PO TABS: 800.0000 mg | ORAL_TABLET | Freq: Three times a day (TID) | ORAL | Status: DC

## 2012-10-25 MED ORDER — HYDROCODONE-ACETAMINOPHEN 5-325 MG PO TABS: 1.0000 | ORAL_TABLET | ORAL | Status: AC | PRN

## 2012-10-25 MED ORDER — CYCLOBENZAPRINE HCL 10 MG PO TABS: 10.0000 mg | ORAL_TABLET | Freq: Two times a day (BID) | ORAL | Status: DC | PRN

## 2012-10-25 MED ORDER — HYDROCODONE-ACETAMINOPHEN 5-325 MG PO TABS
1.0000 | ORAL_TABLET | Freq: Once | ORAL | Status: AC
  Administered 2012-10-25: 1 via ORAL
  Filled 2012-10-25: qty 1

## 2012-10-25 NOTE — ED Provider Notes (Signed)
CSN: [id number]     Arrival date & time 10/25/12  1613 History  This chart was scribed for non-physician practitioner Elpidio Anis, PA-C, working with Vida Roller, MD, by Yevette Edwards, ED Scribe. This patient was seen in room TR06C/TR06C and the patient's care was started at 6:17 PM.   First MD Initiated Contact with Patient 10/25/12 1810     Chief Complaint  Patient presents with  . Fall  . Back Pain    The history is provided by the patient. No language interpreter was used.   HPI Comments: Marcus Mosley is a 22 y.o. male who presents to the Emergency Department complaining of a fall which occurred three days ago. He was on the third step of a ladder, and he fell flat upon his back. The pt reports the pain radiates from his back to his shoulders intermittently. He has used tylenol to mitigate his pain with little resolution. The pt is ambulatory. He denies a h/o prior trauma or surgeries to his back.  He also denies any abdominal pain.   History reviewed. No pertinent past medical history. History reviewed. No pertinent past surgical history. No family history on file. History  Substance Use Topics  . Smoking status: Current Some Day Smoker -- 0.50 packs/day  . Smokeless tobacco: Not on file  . Alcohol Use: No    Review of Systems  Constitutional: Negative for fever and chills.  Gastrointestinal: Negative for abdominal pain.  Musculoskeletal: Positive for back pain.  All other systems reviewed and are negative.   Allergies  Ceclor  Home Medications   Current Outpatient Rx  Name  Route  Sig  Dispense  Refill  . acetaminophen (TYLENOL) 500 MG tablet   Oral   Take 1,000 mg by mouth every 6 (six) hours as needed for pain.          Triage Vitals: BP 111/48  Pulse 70  Temp(Src) 98.2 F (36.8 C) (Oral)  Resp 18  Ht 5\' 8"  (1.727 m)  Wt 145 lb (65.772 kg)  BMI 22.05 kg/m2  SpO2 98%  Physical Exam  Nursing note and vitals reviewed. Constitutional: He is  oriented to person, place, and time. He appears well-developed and well-nourished. No distress.  HENT:  Head: Normocephalic and atraumatic.  Eyes: EOM are normal.  Neck: Neck supple. No tracheal deviation present.  Cardiovascular: Normal rate.   Pulmonary/Chest: Effort normal. No respiratory distress.  Musculoskeletal: Normal range of motion. He exhibits tenderness.  Midline upper lumbar tenderness. No swelling, no ecchymosis, no CVA tenderness. Ambulatory and fully weight bearing.  Neurological: He is alert and oriented to person, place, and time.  Skin: Skin is warm and dry.  Psychiatric: He has a normal mood and affect. His behavior is normal.    ED Course  Procedures (including critical care time)  DIAGNOSTIC STUDIES:  Oxygen Saturation is 98% on room air, normal by my interpretation.    COORDINATION OF CARE:  6:19 PM- Discussed treatment plan with patient, and the patient agreed to the plan.  Dg Lumbar Spine Complete  10/25/2012   *RADIOLOGY REPORT*  Clinical Data: Status post fall 3 days ago.  Low back pain.  LUMBAR SPINE - COMPLETE 4+ VIEW  Comparison: Plain films lumbar spine 05/04/2011.  Findings: Vertebral body height and alignment are normal.  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  No pars interarticularis defect is identified.  Paraspinous structures are unremarkable.  IMPRESSION: No acute finding.  Stable compared to prior exam.   Original Report Authenticated By: Holley Dexter, M.D.   Labs Review Labs Reviewed - No data to display Imaging Review No results found.  MDM  No diagnosis found. 1. Back pain 2. Fall  Uncomplicated muscular back pain without fracture after fall 2 days ago. Normal neurologic exam, minimal findings. Stable for discharge.    I personally performed the services described in this documentation, which was scribed in my presence. The recorded information has been reviewed and is accurate.   Arnoldo Hooker, PA-C 10/25/12 2051

## 2012-10-25 NOTE — ED Notes (Signed)
Pt reports while painting fell off a 3-4 ft ladder from the second step at the bottom. Pt c/o low back pain that radiates upward in waves. Incident occurred about 3 days ago. Pt has tried tylenol without relief.

## 2012-10-25 NOTE — ED Provider Notes (Signed)
Medical screening examination/treatment/procedure(s) were performed by non-physician practitioner and as supervising physician I was immediately available for consultation/collaboration.    Vida Roller, MD 10/25/12 [phone]

## 2014-04-18 ENCOUNTER — Emergency Department (HOSPITAL_COMMUNITY)
Admission: EM | Admit: 2014-04-18 | Discharge: 2014-04-18 | Disposition: A | Discharge status: Home / Self Care | Payer: Self-pay | Attending: Emergency Medicine | Admitting: Emergency Medicine

## 2014-04-18 ENCOUNTER — Encounter (HOSPITAL_COMMUNITY): Payer: Self-pay | Admitting: Family Medicine

## 2014-04-18 ENCOUNTER — Emergency Department (HOSPITAL_COMMUNITY): Payer: Self-pay

## 2014-04-18 DIAGNOSIS — S40811A Abrasion of right upper arm, initial encounter: Secondary | ICD-10-CM | POA: Insufficient documentation

## 2014-04-18 DIAGNOSIS — Z79899 Other long term (current) drug therapy: Secondary | ICD-10-CM | POA: Insufficient documentation

## 2014-04-18 DIAGNOSIS — Y9289 Other specified places as the place of occurrence of the external cause: Secondary | ICD-10-CM | POA: Insufficient documentation

## 2014-04-18 DIAGNOSIS — Y998 Other external cause status: Secondary | ICD-10-CM | POA: Insufficient documentation

## 2014-04-18 DIAGNOSIS — Z720 Tobacco use: Secondary | ICD-10-CM | POA: Insufficient documentation

## 2014-04-18 DIAGNOSIS — S21102A Unspecified open wound of left front wall of thorax without penetration into thoracic cavity, initial encounter: Secondary | ICD-10-CM

## 2014-04-18 DIAGNOSIS — Y9389 Activity, other specified: Secondary | ICD-10-CM | POA: Insufficient documentation

## 2014-04-18 DIAGNOSIS — Z791 Long term (current) use of non-steroidal anti-inflammatories (NSAID): Secondary | ICD-10-CM | POA: Insufficient documentation

## 2014-04-18 DIAGNOSIS — S20311A Abrasion of right front wall of thorax, initial encounter: Secondary | ICD-10-CM | POA: Insufficient documentation

## 2014-04-18 DIAGNOSIS — W11XXXA Fall on and from ladder, initial encounter: Secondary | ICD-10-CM | POA: Insufficient documentation

## 2014-04-18 MED ORDER — CYCLOBENZAPRINE HCL 10 MG PO TABS: 10.0000 mg | ORAL_TABLET | Freq: Two times a day (BID) | ORAL | Status: AC | PRN

## 2014-04-18 MED ORDER — OXYCODONE-ACETAMINOPHEN 5-325 MG PO TABS
ORAL_TABLET | ORAL | Status: AC
  Filled 2014-04-18: qty 1

## 2014-04-18 MED ORDER — OXYCODONE-ACETAMINOPHEN 5-325 MG PO TABS
1.0000 | ORAL_TABLET | Freq: Once | ORAL | Status: AC
  Administered 2014-04-18: 1 via ORAL

## 2014-04-18 MED ORDER — IBUPROFEN 800 MG PO TABS: 800.0000 mg | ORAL_TABLET | Freq: Three times a day (TID) | ORAL | Status: AC | PRN

## 2014-04-18 NOTE — Discharge Instructions (Signed)
Blunt Chest Trauma °Blunt chest trauma is an injury caused by a blow to the chest. These chest injuries can be very painful. Blunt chest trauma often results in bruised or broken (fractured) ribs. Most cases of bruised and fractured ribs from blunt chest traumas get better after 1 to 3 weeks of rest and pain medicine. Often, the soft tissue in the chest wall is also injured, causing pain and bruising. Internal organs, such as the heart and lungs, may also be injured. Blunt chest trauma can lead to serious medical problems. This injury requires immediate medical care. °CAUSES  °· Motor vehicle collisions. °· Falls. °· Physical violence. °· Sports injuries. °SYMPTOMS  °· Chest pain. The pain may be worse when you move or breathe deeply. °· Shortness of breath. °· Lightheadedness. °· Bruising. °· Tenderness. °· Swelling. °DIAGNOSIS  °Your caregiver will do a physical exam. X-rays may be taken to look for fractures. However, minor rib fractures may not show up on X-rays until a few days after the injury. If a more serious injury is suspected, further imaging tests may be done. This may include ultrasounds, computed tomography (CT) scans, or magnetic resonance imaging (MRI). °TREATMENT  °Treatment depends on the severity of your injury. Your caregiver may prescribe pain medicines and deep breathing exercises. °HOME CARE INSTRUCTIONS °· Limit your activities until you can move around without much pain. °· Do not do any strenuous work until your injury is healed. °· Put ice on the injured area. °¨ Put ice in a plastic bag. °¨ Place a towel between your skin and the bag. °¨ Leave the ice on for 15-20 minutes, 03-04 times a day. °· You may wear a rib belt as directed by your caregiver to reduce pain. °· Practice deep breathing as directed by your caregiver to keep your lungs clear. °· Only take over-the-counter or prescription medicines for pain, fever, or discomfort as directed by your caregiver. °SEEK IMMEDIATE MEDICAL  CARE IF:  °· You have increasing pain or shortness of breath. °· You cough up blood. °· You have nausea, vomiting, or abdominal pain. °· You have a fever. °· You feel dizzy, weak, or you faint. °MAKE SURE YOU: °· Understand these instructions. °· Will watch your condition. °· Will get help right away if you are not doing well or get worse. °Document Released: 03/21/2004 Document Revised: 05/06/2011 Document Reviewed: 11/28/2010 °[employer]® Patient Information ©2015 [employer]. This information is not intended to replace advice given to you by your health care provider. Make sure you discuss any questions you have with your health care provider. ° ° °

## 2014-04-18 NOTE — ED Provider Notes (Signed)
CSN: [id number]     Arrival date & time 04/18/14  1043 History  This chart was scribed for non-physician practitioner, Fayrene HelperBowie Nithya Meriweather, PA-C, working with Gwyneth SproutWhitney Plunkett, MD by Charline BillsEssence Howell, ED Scribe. This patient was seen in room TR05C/TR05C and the patient's care was started at 2:08 PM.   Chief Complaint  Patient presents with  . Fall   The history is provided by the patient. No language interpreter was used.   HPI Comments: Marcus Mosley is a 24 y.o. male, with no pertinent medical history, who presents to the Emergency Department complaining of fall that occurred last night. 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 Pt describes R rib pain as a 8/10, constant, sharp sensation that is exacerbated with deep breathing, coughing and touching. Pt reports a tinge of blood with coughing once that has resolved. He denies LOC, visual disturbances, hearing loss, light-headedness, dizziness, abdominal pain, back pain, numbness.   History reviewed. No pertinent past medical history. History reviewed. No pertinent past surgical history. History reviewed. No pertinent family history. History  Substance Use Topics  . Smoking status: Current Some Day Smoker -- 0.50 packs/day  . Smokeless tobacco: Not on file  . Alcohol Use: No    Review of Systems  HENT: Negative for hearing loss.   Eyes: Negative for visual disturbance.  Gastrointestinal: Negative for abdominal pain.  Musculoskeletal: Positive for myalgias. Negative for back pain.  Skin: Positive for wound.  Neurological: Negative for dizziness, syncope, light-headedness and numbness.    Allergies  Ceclor  Home Medications   Prior to Admission medications   Medication Sig Start Date End Date Taking? Authorizing Provider  acetaminophen (TYLENOL) 500 MG tablet Take 1,000 mg by mouth every 6 (six) hours as needed for pain.    Historical Provider, MD  cyclobenzaprine  (FLEXERIL) 10 MG tablet Take 1 tablet (10 mg total) by mouth 2 (two) times daily as needed for muscle spasms. 10/25/12   Shari A Upstill, PA-C  HYDROcodone-acetaminophen (NORCO/VICODIN) 5-325 MG per tablet Take 1 tablet by mouth every 4 (four) hours as needed for pain. 10/25/12   Shari A Upstill, PA-C  ibuprofen (ADVIL,MOTRIN) 800 MG tablet Take 1 tablet (800 mg total) by mouth 3 (three) times daily. 10/25/12   Shari A Upstill, PA-C   BP 110/61 mmHg  Pulse 66  Temp(Src) 97.8 F (36.6 C) (Oral)  Resp 18  SpO2 98% Physical Exam  Constitutional: He is oriented to person, place, and time. He appears well-developed and well-nourished. No distress.  HENT:  Head: Normocephalic and atraumatic.  Eyes: Conjunctivae and EOM are normal.  Neck: Neck supple. No tracheal deviation present.  Cardiovascular: Normal rate.   Pulmonary/Chest: Effort normal. No respiratory distress.  Musculoskeletal: Normal range of motion.  Small abrasion to R upper arm. No signs on infection. Abrasion across R side of chest. No signs of infections.  No tenderness to anterior L side. Tenderness around midline of R nipple down to bottom of R rib cage. Full ROM in R arm.  Neurological: He is alert and oriented to person, place, and time.  No loss of sensation.   Skin: Skin is warm and dry.  Psychiatric: He has a normal mood and affect. His behavior is normal.  Nursing note and vitals reviewed.  ED Course  Procedures (including critical care time) DIAGNOSTIC STUDIES: Oxygen Saturation is 98% on RA, normal by my interpretation.  COORDINATION OF CARE: 2:12 PM-Discussed treatment plan which includes XR, Percocet, Flexeril and ibuprofen with pt at bedside. Also discussed follow-up with ortho if needed and pt agreed to plan. No evidence to suggest internal injury such as liver, or kidney injury.  Labs Review Labs Reviewed - No data to display  Imaging Review Dg Ribs Unilateral W/chest Right  04/18/2014   CLINICAL DATA:   Patient fell 6 feet from ladder with pain  EXAM: RIGHT RIBS AND CHEST - 3+ VIEW  COMPARISON:  Chest radiograph November 03, 2013  FINDINGS: Frontal chest as well as oblique and cone-down lower rib images were obtained. Lungs are clear. Heart size pulmonary vascularity are normal. No adenopathy. No pneumothorax or effusion. No demonstrable rib fracture.  IMPRESSION: No demonstrable rib fracture.  Lungs clear.   Electronically Signed   By: Bretta Bang III M.D.   On: 04/18/2014 12:45    EKG Interpretation None      MDM   Final diagnoses:  Chest wall soft tissue injury, left, initial encounter    BP 114/59 mmHg  Pulse 65  Temp(Src) 97.8 F (36.6 C) (Oral)  Resp 18  SpO2 100%  I have reviewed nursing notes and vital signs. I personally reviewed the imaging tests through PACS system  I reviewed available ER/hospitalization records thought the EMR   I personally performed the services described in this documentation, which was scribed in my presence. The recorded information has been reviewed and is accurate.     Fayrene Helper, PA-C 04/18/14 1550  Rolland Porter, MD 04/20/14 1455

## 2014-04-18 NOTE — ED Notes (Signed)
Pt sts fell off 6 foot ladder last night. sts landed on right rib area.

## 2016-05-05 ENCOUNTER — Emergency Department (HOSPITAL_COMMUNITY)
Admission: EM | Admit: 2016-05-05 | Discharge: 2016-05-06 | Disposition: A | Discharge status: Home / Self Care | Payer: Self-pay | Attending: Emergency Medicine | Admitting: Emergency Medicine

## 2016-05-05 ENCOUNTER — Encounter (HOSPITAL_COMMUNITY): Payer: Self-pay

## 2016-05-05 DIAGNOSIS — R300 Dysuria: Secondary | ICD-10-CM | POA: Insufficient documentation

## 2016-05-05 DIAGNOSIS — R109 Unspecified abdominal pain: Secondary | ICD-10-CM | POA: Insufficient documentation

## 2016-05-05 DIAGNOSIS — Z79899 Other long term (current) drug therapy: Secondary | ICD-10-CM | POA: Insufficient documentation

## 2016-05-05 DIAGNOSIS — F17210 Nicotine dependence, cigarettes, uncomplicated: Secondary | ICD-10-CM | POA: Insufficient documentation

## 2016-05-05 LAB — URINALYSIS, ROUTINE W REFLEX MICROSCOPIC
Bilirubin Urine: NEGATIVE
GLUCOSE, UA: NEGATIVE mg/dL
KETONES UR: NEGATIVE mg/dL
LEUKOCYTES UA: NEGATIVE
Nitrite: NEGATIVE
PROTEIN: 30 mg/dL — AB
Specific Gravity, Urine: 1.02 (ref 1.005–1.030)
pH: 5 (ref 5.0–8.0)

## 2016-05-05 MED ORDER — HYDROCODONE-ACETAMINOPHEN 5-325 MG PO TABS
2.0000 | ORAL_TABLET | Freq: Once | ORAL | Status: AC
Start: 2016-05-06 — End: 2016-05-06
  Administered 2016-05-06: 2 via ORAL
  Filled 2016-05-05: qty 2

## 2016-05-05 NOTE — ED Provider Notes (Signed)
[facility]-EMERGENCY DEPT Provider Note   CSN: [id number] Arrival date & time: 05/05/16  2238  By signing my name below, I, Soijett Blue, attest that this documentation has been prepared under the direction and in the presence of Roxy Horseman, PA-C Electronically Signed: Soijett Blue, ED Scribe. 05/05/16. 11:44 PM.  History   Chief Complaint Chief Complaint  Patient presents with  . Back Pain  . Dysuria    HPI Marcus Mosley is a 26 y.o. male who presents to the Emergency Department complaining of sharp right lower back pain onset 2 days ago. Pt reports associated dysuria, urinary hesitancy, and urgency. Pt has not tried any medications for the relief of his symptoms. He states that he was working on his car prior to the onset of his symptoms. Pt right lower back pain is worsened with bending over. Pt denies fever, chills, vomiting, and any other symptoms. Denies daily medication use, PMHx of kidney stones, or IV drug use.    The history is provided by the patient. No language interpreter was used.    History reviewed. No pertinent past medical history.  There are no active problems to display for this patient.   History reviewed. No pertinent surgical history.     Home Medications    Prior to Admission medications   Medication Sig Start Date End Date Taking? Authorizing Provider  acetaminophen (TYLENOL) 500 MG tablet Take 1,000 mg by mouth every 6 (six) hours as needed for pain.    Historical Provider, MD  cyclobenzaprine (FLEXERIL) 10 MG tablet Take 1 tablet (10 mg total) by mouth 2 (two) times daily as needed for muscle spasms. 04/18/14   Fayrene Helper, PA-C  HYDROcodone-acetaminophen (NORCO/VICODIN) 5-325 MG per tablet Take 1 tablet by mouth every 4 (four) hours as needed for pain. 10/25/12   Elpidio Anis, PA-C  ibuprofen (ADVIL,MOTRIN) 800 MG tablet Take 1 tablet (800 mg total) by mouth every 8 (eight) hours as needed for moderate pain. 04/18/14   Fayrene Helper, PA-C    Family  History History reviewed. No pertinent family history.  Social History Social History  Substance Use Topics  . Smoking status: Current Every Day Smoker    Packs/day: 1.00    Types: Cigarettes  . Smokeless tobacco: Never Used  . Alcohol use No     Allergies   Ceclor [cefaclor]   Review of Systems Review of Systems  Constitutional: Negative for chills and fever.  Gastrointestinal: Negative for vomiting.  Genitourinary: Positive for dysuria and urgency.       +Urinary hesitancy   Musculoskeletal: Positive for back pain (right lower).     Physical Exam Updated Vital Signs BP 131/82 (BP Location: Left Arm)   Pulse 105   Temp 98.5 F (36.9 C) (Oral)   Resp 14   Ht 5\' 6"  (1.676 m)   Wt 148 lb (67.1 kg)   SpO2 100%   BMI 23.89 kg/m   Physical Exam  Constitutional: He is oriented to person, place, and time. He appears well-developed and well-nourished. No distress.  HENT:  Head: Normocephalic and atraumatic.  Eyes: EOM are normal.  Neck: Neck supple.  Cardiovascular: Normal rate.   Pulmonary/Chest: Effort normal. No respiratory distress.  Abdominal: He exhibits no distension. There is no tenderness. There is CVA tenderness.  Right sided CVA tenderness. No focal abdominal tenderness.   Musculoskeletal: Normal range of motion.       Lumbar back: He exhibits no tenderness.  No lumbar paraspinal tenderness.   Neurological: He  is alert and oriented to person, place, and time.  Skin: Skin is warm and dry.  Psychiatric: He has a normal mood and affect. His behavior is normal.  Nursing note and vitals reviewed.    ED Treatments / Results  DIAGNOSTIC STUDIES: Oxygen Saturation is 100% on RA, nl by my interpretation.    COORDINATION OF CARE: 11:41 PM Discussed treatment plan with pt at bedside which includes UA, CT renal stone study, and pt agreed to plan.   Labs (all labs ordered are listed, but only abnormal results are displayed) Labs Reviewed  URINALYSIS,  ROUTINE W REFLEX MICROSCOPIC - Abnormal; Notable for the following:       Result Value   APPearance HAZY (*)    Hgb urine dipstick MODERATE (*)    Protein, ur 30 (*)    Bacteria, UA FEW (*)    Squamous Epithelial / LPF 0-5 (*)    All other components within normal limits  I-STAT CHEM 8, ED - Abnormal; Notable for the following:    Calcium, Ion 1.13 (*)    All other components within normal limits  URINE CULTURE    EKG  EKG Interpretation None       Radiology Ct Renal Stone Study  Result Date: 05/06/2016 CLINICAL DATA:  Initial evaluation for acute right flank pain, hematuria. EXAM: CT ABDOMEN AND PELVIS WITHOUT CONTRAST TECHNIQUE: Multidetector CT imaging of the abdomen and pelvis was performed following the standard protocol without IV contrast. COMPARISON:  None. FINDINGS: Lower chest: Visualized lung bases are clear. Hepatobiliary: Liver demonstrates a normal unenhanced appearance. Gallbladder within normal limits. No biliary dilatation. Pancreas: Pancreas within normal limits. Spleen: Spleen within normal limits. Adrenals/Urinary Tract: Adrenal glands are normal. Few punctate nonobstructive calculi present within the mid and lower aspect of the right kidney, measuring up to 2 mm. No appreciable radiopaque calculi seen along the expected course of the right renal collecting system. No right-sided hydronephrosis or hydroureter. On the left. Probable faint punctate nonobstructive stones within the upper and mid aspect of the left kidney. No appreciable radiopaque calculi seen along the expected course of the left renal collecting system. No left-sided hydronephrosis or hydroureter. Bladder partially distended but within normal limits. No layering stones within the bladder lumen. Stomach/Bowel: Stomach within normal limits. No evidence for bowel obstruction. Appendix normal. No acute inflammatory changes seen about the bowels. Vascular/Lymphatic: Intra-abdominal aorta of normal caliber. No  appreciable adenopathy on this noncontrast examination. Reproductive: Prostate normal. Other: No free air or fluid. Musculoskeletal: No acute osseous abnormality. No worrisome lytic or blastic osseous lesions. IMPRESSION: 1. Faint punctate nonobstructive calculi within the kidneys bilaterally. No CT evidence for obstructive uropathy. No ureterolithiasis. 2. No other acute intra-abdominal or pelvic process identified. Electronically Signed   By: Rise Mu M.D.   On: 05/06/2016 00:31    Procedures Procedures (including critical care time)  Medications Ordered in ED Medications - No data to display   Initial Impression / Assessment and Plan / ED Course  I have reviewed the triage vital signs and the nursing notes.  Pertinent labs & imaging results that were available during my care of the patient were reviewed by me and considered in my medical decision making (see chart for details).     Patient with dysuria and urinary hesitancy 2 days. He denies any fever or chills. Denies any penile discharge. He initially thought that his symptoms might have been related to working on a car, however I am less suspicious of this. He  has no focal muscular tenderness. He does have some blood in his urine, and his symptoms sound suspicious for UTI versus possible kidney stone. Will check renal stone study to rule out kidney stone.  CT shows very small punctate stones in bilateral kidneys. It is possible the patient may have recently passed a small stone. His UA is inconsistent with UTI, however his symptoms are somewhat concerning for UTI. Will treat with Bactrim, as he is allergic to cephalosporins. Will send urine for culture. Recommend primary care/urology follow-up as needed. Return precautions given.  Final Clinical Impressions(s) / ED Diagnoses   Final diagnoses:  Dysuria    New Prescriptions Discharge Medication List as of 05/06/2016 12:43 AM    START taking these medications   Details    sulfamethoxazole-trimethoprim (BACTRIM DS,SEPTRA DS) 800-160 MG tablet Take 1 tablet by mouth 2 (two) times daily., Starting Mon 05/06/2016, Until Mon 05/13/2016, Print       I personally performed the services described in this documentation, which was scribed in my presence. The recorded information has been reviewed and is accurate.      Roxy Horsemanobert Yvonne Stopher, PA-C 05/06/16 0111    Gilda Creasehristopher J Pollina, MD 05/06/16 [phone]

## 2016-05-05 NOTE — ED Triage Notes (Signed)
Pt endorses lumbar pain that began after doing a break job on a car 2 days ago. Pt also endorses urgency and dysuria stating "I feel like I have to pee a lot but only a little comes out" VSS.

## 2016-05-06 ENCOUNTER — Emergency Department (HOSPITAL_COMMUNITY): Payer: Self-pay

## 2016-05-06 LAB — I-STAT CHEM 8, ED
BUN: 11 mg/dL (ref 6–20)
CALCIUM ION: 1.13 mmol/L — AB (ref 1.15–1.40)
CHLORIDE: 105 mmol/L (ref 101–111)
Creatinine, Ser: 0.9 mg/dL (ref 0.61–1.24)
GLUCOSE: 81 mg/dL (ref 65–99)
HCT: 45 % (ref 39.0–52.0)
Hemoglobin: 15.3 g/dL (ref 13.0–17.0)
Potassium: 3.8 mmol/L (ref 3.5–5.1)
SODIUM: 144 mmol/L (ref 135–145)
TCO2: 29 mmol/L (ref 0–100)

## 2016-05-06 MED ORDER — SULFAMETHOXAZOLE-TRIMETHOPRIM 800-160 MG PO TABS: 1.0000 | ORAL_TABLET | Freq: Two times a day (BID) | ORAL | 0 refills | Status: AC

## 2016-05-06 NOTE — ED Notes (Signed)
Patient transported to CT 

## 2016-05-06 NOTE — ED Notes (Signed)
Pt and family understood dc material. NAD noted. Scripts given at dc 

## 2016-05-07 LAB — URINE CULTURE

## 2016-06-10 ENCOUNTER — Emergency Department (HOSPITAL_COMMUNITY): Payer: Self-pay

## 2016-06-10 ENCOUNTER — Encounter (HOSPITAL_COMMUNITY): Payer: Self-pay | Admitting: Emergency Medicine

## 2016-06-10 DIAGNOSIS — R079 Chest pain, unspecified: Secondary | ICD-10-CM | POA: Insufficient documentation

## 2016-06-10 DIAGNOSIS — Z79899 Other long term (current) drug therapy: Secondary | ICD-10-CM | POA: Insufficient documentation

## 2016-06-10 DIAGNOSIS — F17210 Nicotine dependence, cigarettes, uncomplicated: Secondary | ICD-10-CM | POA: Insufficient documentation

## 2016-06-10 DIAGNOSIS — F419 Anxiety disorder, unspecified: Secondary | ICD-10-CM | POA: Insufficient documentation

## 2016-06-10 NOTE — ED Triage Notes (Signed)
Pt c/o 6/10 left side cp that started 3 hours ago, pt got ASA  x 3 with some relief, but still having CP, denies any SOB or nausea at this time.

## 2016-06-11 ENCOUNTER — Emergency Department (HOSPITAL_COMMUNITY)
Admission: EM | Admit: 2016-06-11 | Discharge: 2016-06-11 | Disposition: A | Discharge status: Home / Self Care | Payer: Self-pay | Attending: Emergency Medicine | Admitting: Emergency Medicine

## 2016-06-11 DIAGNOSIS — R079 Chest pain, unspecified: Secondary | ICD-10-CM

## 2016-06-11 LAB — BASIC METABOLIC PANEL
Anion gap: 9 (ref 5–15)
BUN: 13 mg/dL (ref 6–20)
CHLORIDE: 105 mmol/L (ref 101–111)
CO2: 25 mmol/L (ref 22–32)
Calcium: 9.3 mg/dL (ref 8.9–10.3)
Creatinine, Ser: 0.87 mg/dL (ref 0.61–1.24)
GFR calc Af Amer: >60 mL/min (ref >60)
GFR calc non Af Amer: >60 mL/min (ref >60)
Glucose, Bld: 86 mg/dL (ref 65–99)
POTASSIUM: 4.1 mmol/L (ref 3.5–5.1)
SODIUM: 139 mmol/L (ref 135–145)

## 2016-06-11 LAB — CBC
HEMATOCRIT: 45.8 % (ref 39.0–52.0)
HEMOGLOBIN: 16 g/dL (ref 13.0–17.0)
MCH: 31.5 pg (ref 26.0–34.0)
MCHC: 34.9 g/dL (ref 30.0–36.0)
MCV: 90.2 fL (ref 78.0–100.0)
Platelets: 307 10*3/uL (ref 150–400)
RBC: 5.08 MIL/uL (ref 4.22–5.81)
RDW: 13.3 % (ref 11.5–15.5)
WBC: 14.7 10*3/uL — AB (ref 4.0–10.5)

## 2016-06-11 LAB — I-STAT TROPONIN, ED: Troponin i, poc: 0 ng/mL (ref 0.00–0.08)

## 2016-06-11 MED ORDER — CHLORDIAZEPOXIDE HCL 25 MG PO CAPS: 25.0000 mg | ORAL_CAPSULE | Freq: Three times a day (TID) | ORAL | 0 refills | Status: DC | PRN

## 2016-06-11 MED ORDER — CHLORDIAZEPOXIDE HCL 25 MG PO CAPS: 25.0000 mg | ORAL_CAPSULE | Freq: Every day | ORAL | 0 refills | Status: AC | PRN

## 2016-06-11 NOTE — ED Provider Notes (Signed)
[facility]-EMERGENCY DEPT Provider Note   CSN: [id number] Arrival date & time: 06/10/16  2331   By signing my name below, I, Nelwyn Salisbury, attest that this documentation has been prepared under the direction and in the presence of Tomasita Crumble, MD . Electronically Signed: Nelwyn Salisbury, Scribe. 06/11/2016. 3:07 AM.  History   Chief Complaint Chief Complaint  Patient presents with  . Chest Pain   HPI  HPI Comments:  Marcus Mosley is a 26 y.o. male who presents to the Emergency Department complaining of constant, mild chest pain onset yesterday. He describes his symptoms as a sharp pain that radiates up into his upper chest. No mechanism of injury noted. Pt reports associated chills. He states that he occasionally suffers from panic attacks and that his symptoms feel similar but worse than his typical panic attack symptoms. Pt notes that he has had more stress in his life lately which could have contributed to his symptoms. No medications tried PTA. Denies any nausea or vomiting. No recent hx of long travel or recent shx.    History reviewed. No pertinent past medical history.  There are no active problems to display for this patient.   Past Surgical History:  Procedure Laterality Date  . AMPUTATION     pinky left hand       Home Medications    Prior to Admission medications   Medication Sig Start Date End Date Taking? Authorizing Provider  acetaminophen (TYLENOL) 500 MG tablet Take 1,000 mg by mouth every 6 (six) hours as needed for pain.    Historical Provider, MD  cyclobenzaprine (FLEXERIL) 10 MG tablet Take 1 tablet (10 mg total) by mouth 2 (two) times daily as needed for muscle spasms. 04/18/14   Fayrene Helper, PA-C  HYDROcodone-acetaminophen (NORCO/VICODIN) 5-325 MG per tablet Take 1 tablet by mouth every 4 (four) hours as needed for pain. 10/25/12   Elpidio Anis, PA-C  ibuprofen (ADVIL,MOTRIN) 800 MG tablet Take 1 tablet (800 mg total) by mouth every 8 (eight) hours as needed  for moderate pain. 04/18/14   Fayrene Helper, PA-C    Family History History reviewed. No pertinent family history.  Social History Social History  Substance Use Topics  . Smoking status: Current Every Day Smoker    Packs/day: 1.00    Types: Cigarettes  . Smokeless tobacco: Never Used  . Alcohol use No     Allergies   Ceclor [cefaclor]   Review of Systems Review of Systems All systems reviewed and are negative for acute change except as noted in the HPI.  Physical Exam Updated Vital Signs BP 126/84 (BP Location: Right Arm)   Pulse 67   Temp 97.9 F (36.6 C) (Oral)   Resp 20   Ht  (1.676 m)   Wt 148 lb (67.1 kg)   SpO2 99%   BMI 23.89 kg/m   Physical Exam  Constitutional: He is oriented to person, place, and time. Vital signs are normal. He appears well-developed and well-nourished.  Non-toxic appearance. He does not appear ill. No distress.  HENT:  Head: Normocephalic and atraumatic.  Nose: Nose normal.  Mouth/Throat: Oropharynx is clear and moist. No oropharyngeal exudate.  Eyes: Conjunctivae and EOM are normal. Pupils are equal, round, and reactive to light. No scleral icterus.  Neck: Normal range of motion. Neck supple. No tracheal deviation, no edema, no erythema and normal range of motion present. No thyroid mass and no thyromegaly present.  Cardiovascular: Normal rate, regular rhythm, S1 normal, S2 normal, normal  heart sounds, intact distal pulses and normal pulses.  Exam reveals no gallop and no friction rub.   No murmur heard. Pulmonary/Chest: Effort normal and breath sounds normal. No respiratory distress. He has no wheezes. He has no rhonchi. He has no rales.  Abdominal: Soft. Normal appearance and bowel sounds are normal. He exhibits no distension, no ascites and no mass. There is no hepatosplenomegaly. There is no tenderness. There is no rebound, no guarding and no CVA tenderness.  Musculoskeletal: Normal range of motion. He exhibits no edema or  tenderness.  Lymphadenopathy:    He has no cervical adenopathy.  Neurological: He is alert and oriented to person, place, and time. He has normal strength. No cranial nerve deficit or sensory deficit.  Skin: Skin is warm, dry and intact. No petechiae and no rash noted. He is not diaphoretic. No erythema. No pallor.  Psychiatric:  Appears anxious  Nursing note and vitals reviewed.    ED Treatments / Results  DIAGNOSTIC STUDIES:  Oxygen Saturation is 99% on RA, normal by my interpretation.    COORDINATION OF CARE:  3:19 AM Discussed treatment plan with pt at bedside which includes medications to manage his symptoms and pt agreed to plan.  Labs (all labs ordered are listed, but only abnormal results are displayed) Labs Reviewed  CBC - Abnormal; Notable for the following:       Result Value   WBC 14.7 (*)    All other components within normal limits  BASIC METABOLIC PANEL  I-STAT TROPOININ, ED    EKG  EKG Interpretation None       Radiology Dg Chest 2 View  Result Date: 06/10/2016 CLINICAL DATA:  Left-sided chest pain for couple of hours. EXAM: CHEST  2 VIEW COMPARISON:  04/18/2014 FINDINGS: Normal inspiration. Normal heart size and pulmonary vascularity. No focal airspace disease or consolidation in the lungs. No blunting of costophrenic angles. No pneumothorax. Mediastinal contours appear intact. IMPRESSION: No active cardiopulmonary disease. Electronically Signed   By: Burman Nieves M.D.   On: 06/10/2016 23:57    Procedures Procedures (including critical care time)  Medications Ordered in ED Medications - No data to display   Initial Impression / Assessment and Plan / ED Course  I have reviewed the triage vital signs and the nursing notes.  Pertinent labs & imaging results that were available during my care of the patient were reviewed by me and considered in my medical decision making (see chart for details).       Patient presents to the ED for chest  pain, which he states is consistent with his anxiety.  History is not consistent with ACS.  HEART score is zero.  Troponin sent by triage and not clinically indicated.  It was negative.  EKG and cxr unremarkable.  Plan to DC with librium to take as needed for anxiety. He was given 3 caps. PCP fu advised within 3 days. He appears well and in NAD. VS remain within his normal limits and he I ssafe for DC.   Final Clinical Impressions(s) / ED Diagnoses   Final diagnoses:  None    New Prescriptions New Prescriptions   No medications on file    I personally performed the services described in this documentation, which was scribed in my presence. The recorded information has been reviewed and is accurate.      Tomasita Crumble, MD 06/11/16 [phone]

## 2018-05-19 IMAGING — CT CT RENAL STONE PROTOCOL
2 of 4 series · 12 of 46 positions shown, 14 images · non-contrast
Comparison: None.

CLINICAL DATA: Initial evaluation for acute right flank pain,
hematuria.

EXAM:
CT ABDOMEN AND PELVIS WITHOUT CONTRAST
TECHNIQUE: Multidetector CT imaging of the abdomen and pelvis was performed
following the standard protocol without IV contrast.

[Series 201: stone study, idose (2) · axial · 0.68mm/px · z∈[+54,+414]mm · 9 of 86 slices shown, 11 images]
[im 7/86  soft-tissue]
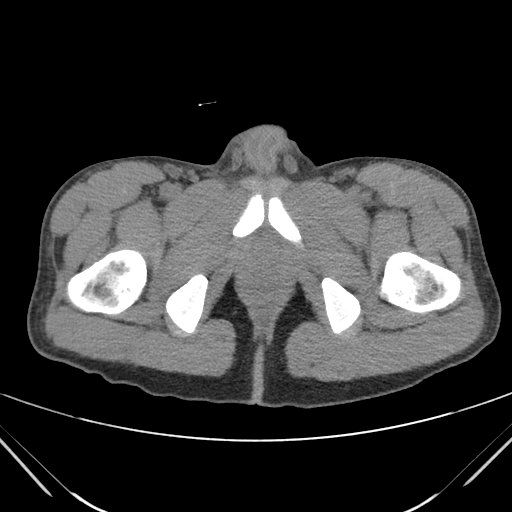
[im 7/86  bone]
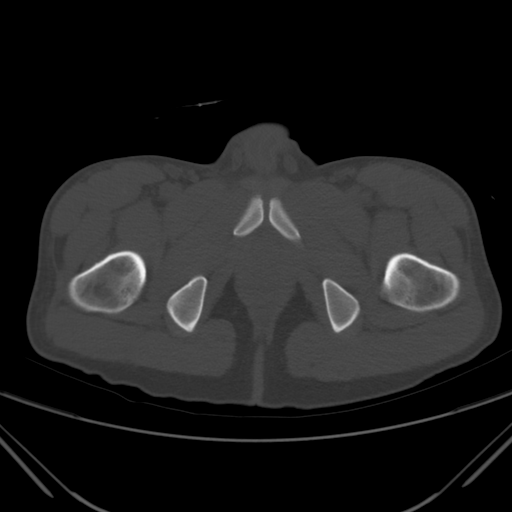
[im 18/86  soft-tissue]
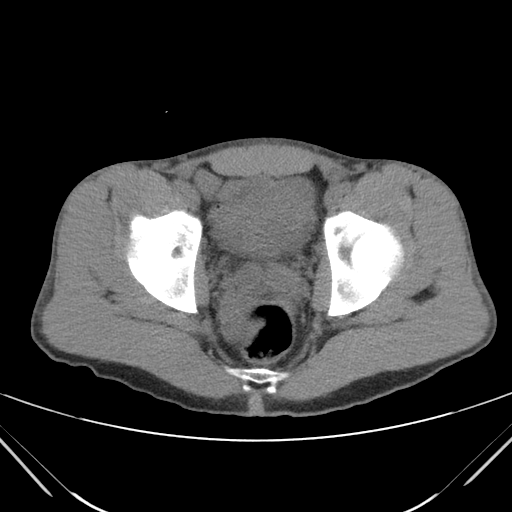
[im 24/86  soft-tissue]
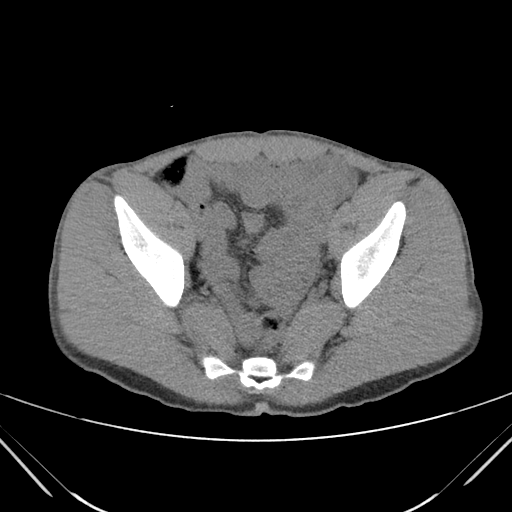
[im 35/86  soft-tissue]
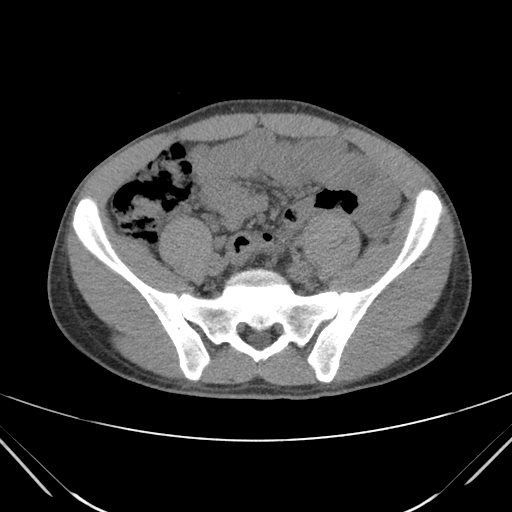
[im 45/86  soft-tissue]
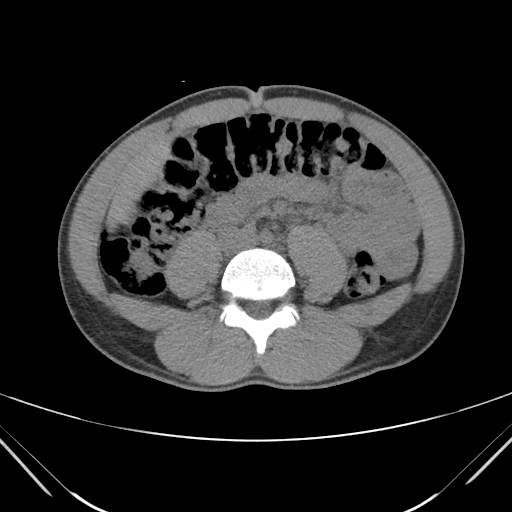
[im 52/86  soft-tissue]
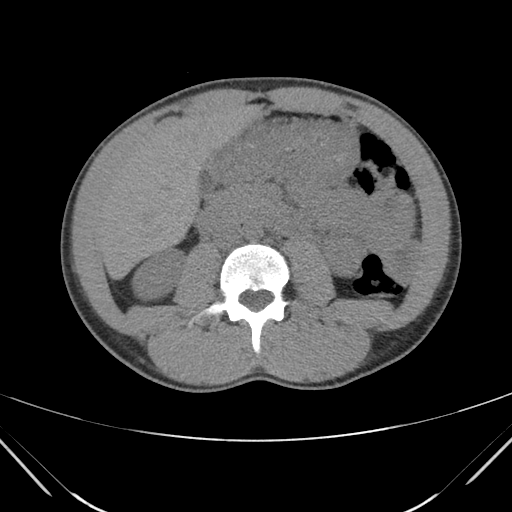
[im 62/86  soft-tissue]
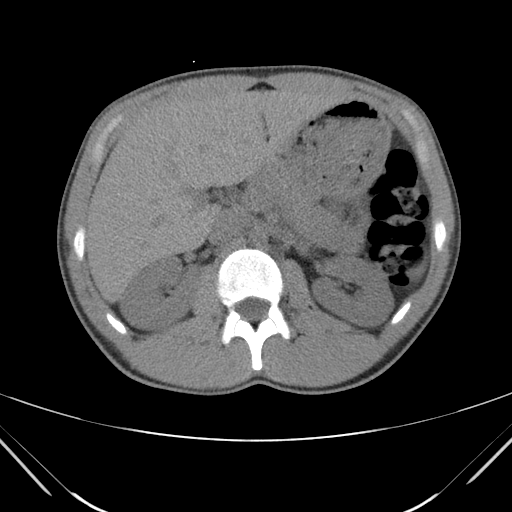
[im 72/86  soft-tissue]
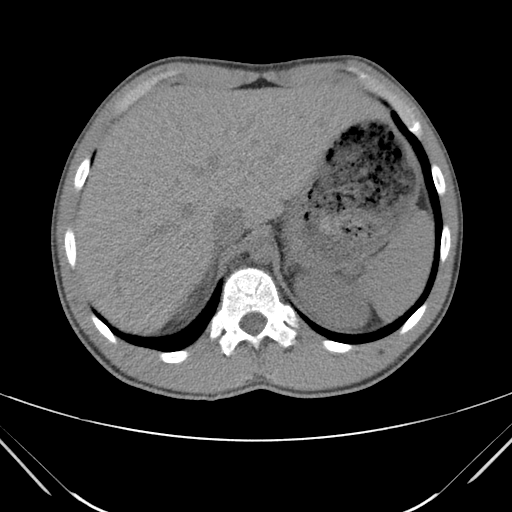
[im 79/86  soft-tissue]
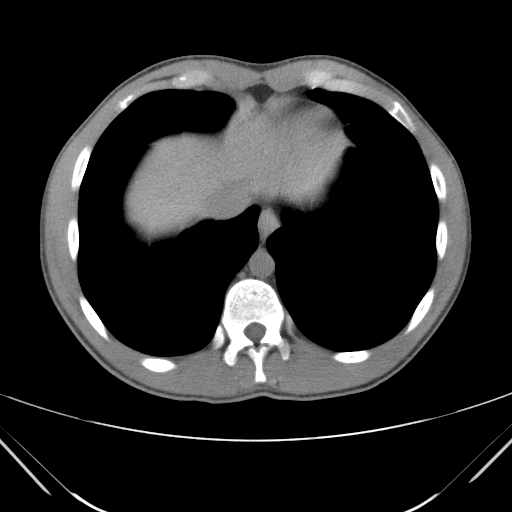
[im 79/86  bone]
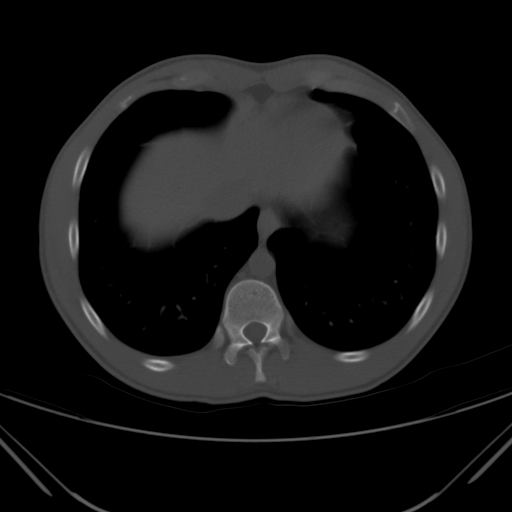

[Series 203: coronals, idose (3) · coronal · 0.45mm/px · 3 of 103 slices shown]
[im 35/103  soft-tissue]
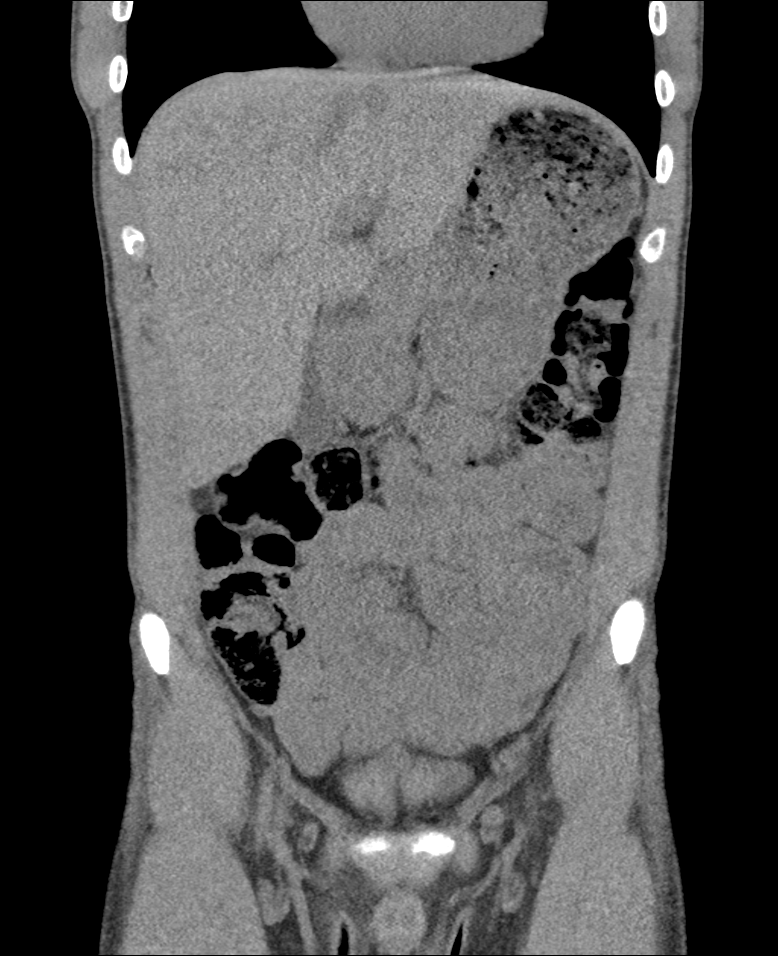
[im 46/103  soft-tissue]
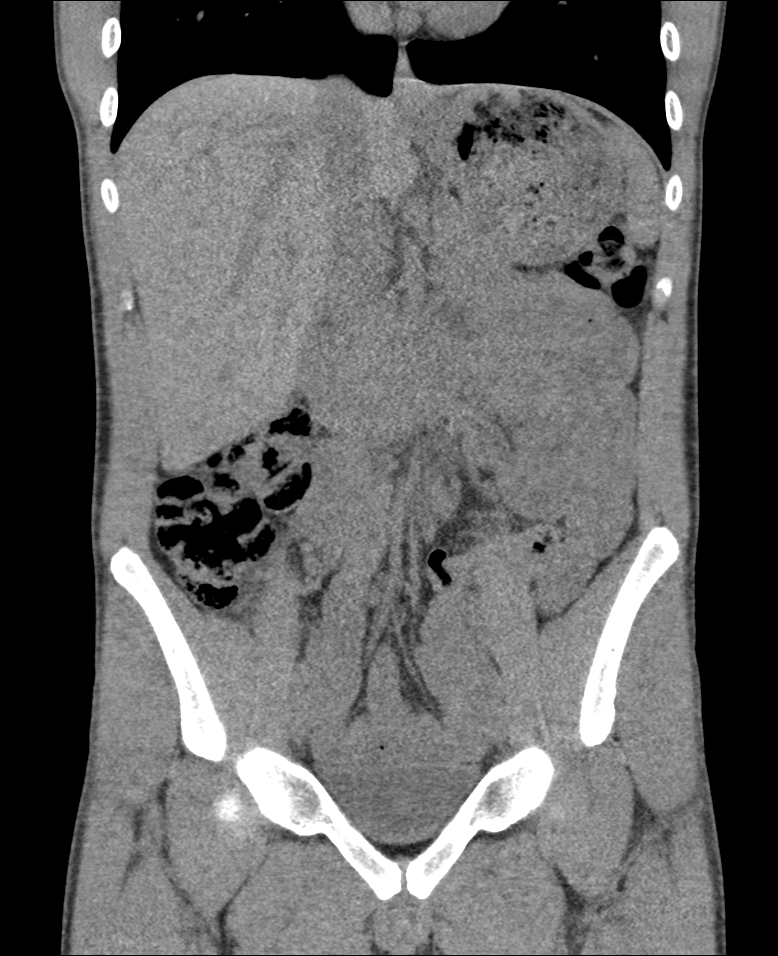
[im 57/103  soft-tissue]
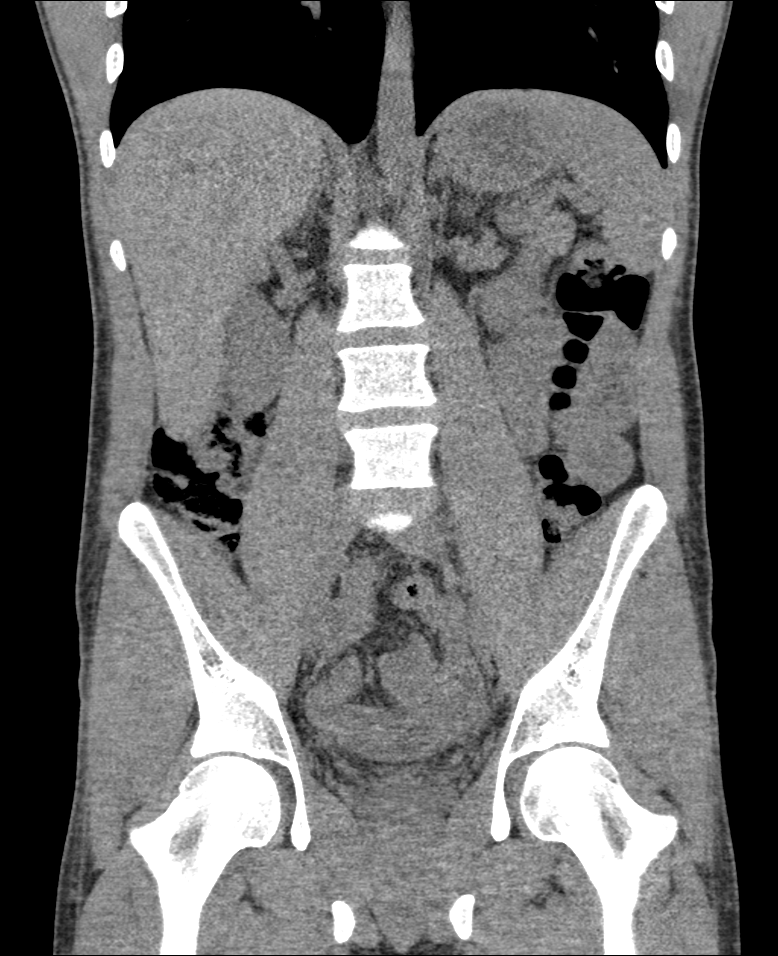

[12 of 46 positions shown; findings below may reference images not displayed]

FINDINGS: Lower chest: Visualized lung bases are clear.

Hepatobiliary: Liver demonstrates a normal unenhanced appearance.
Gallbladder within normal limits. No biliary dilatation.

Pancreas: Pancreas within normal limits.

Spleen: Spleen within normal limits.

Adrenals/Urinary Tract: Adrenal glands are normal.

Few punctate nonobstructive calculi present within the mid and lower
aspect of the right kidney, measuring up to 2 mm. No appreciable
radiopaque calculi seen along the expected course of the right renal
collecting system. No right-sided hydronephrosis or hydroureter.

On the left. Probable faint punctate nonobstructive stones within
the upper and mid aspect of the left kidney. No appreciable
radiopaque calculi seen along the expected course of the left renal
collecting system. No left-sided hydronephrosis or hydroureter.
Bladder partially distended but within normal limits. No layering
stones within the bladder lumen.

Stomach/Bowel: Stomach within normal limits. No evidence for bowel
obstruction. Appendix normal. No acute inflammatory changes seen
about the bowels.

Vascular/Lymphatic: Intra-abdominal aorta of normal caliber. No
appreciable adenopathy on this noncontrast examination.

Reproductive: Prostate normal.

Other: No free air or fluid.

Musculoskeletal: No acute osseous abnormality. No worrisome lytic or
blastic osseous lesions.
IMPRESSION: 1. Faint punctate nonobstructive calculi within the kidneys
bilaterally. No CT evidence for obstructive uropathy. No
ureterolithiasis.
2. No other acute intra-abdominal or pelvic process identified.
